# Patient Record
Sex: Male | Born: 1990 | Race: White | Hispanic: No | Marital: Single | Smoking: Never smoker
Health system: Southern US, Community
[De-identification: ages and names within clinical notes are randomized; demographics above are authoritative.]

## PROBLEM LIST (undated history)

## (undated) HISTORY — PX: OTHER SURGICAL HISTORY: SHX169

---
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
## 2016-04-13 ENCOUNTER — Encounter (HOSPITAL_COMMUNITY): Payer: Self-pay | Admitting: Emergency Medicine

## 2016-04-13 ENCOUNTER — Emergency Department (HOSPITAL_COMMUNITY)
Admission: EM | Admit: 2016-04-13 | Discharge: 2016-04-14 | Disposition: A | Discharge status: Home / Self Care | Payer: Self-pay | Attending: Emergency Medicine | Admitting: Emergency Medicine

## 2016-04-13 ENCOUNTER — Emergency Department (HOSPITAL_COMMUNITY): Payer: Self-pay

## 2016-04-13 DIAGNOSIS — W25XXXA Contact with sharp glass, initial encounter: Secondary | ICD-10-CM | POA: Insufficient documentation

## 2016-04-13 DIAGNOSIS — Y9289 Other specified places as the place of occurrence of the external cause: Secondary | ICD-10-CM | POA: Insufficient documentation

## 2016-04-13 DIAGNOSIS — IMO0002 Reserved for concepts with insufficient information to code with codable children: Secondary | ICD-10-CM

## 2016-04-13 DIAGNOSIS — S51811A Laceration without foreign body of right forearm, initial encounter: Secondary | ICD-10-CM | POA: Insufficient documentation

## 2016-04-13 DIAGNOSIS — Y999 Unspecified external cause status: Secondary | ICD-10-CM | POA: Insufficient documentation

## 2016-04-13 DIAGNOSIS — S4991XA Unspecified injury of right shoulder and upper arm, initial encounter: Secondary | ICD-10-CM

## 2016-04-13 DIAGNOSIS — Y939 Activity, unspecified: Secondary | ICD-10-CM | POA: Insufficient documentation

## 2016-04-13 LAB — ETHANOL: Alcohol, Ethyl (B): 90 mg/dL — ABNORMAL HIGH (ref <5)

## 2016-04-13 MED ORDER — CEFAZOLIN IN D5W 1 GM/50ML IV SOLN
1.0000 g | Freq: Once | INTRAVENOUS | Status: AC
  Administered 2016-04-13: 1 g via INTRAVENOUS
  Filled 2016-04-13: qty 50

## 2016-04-13 MED ORDER — LIDOCAINE-EPINEPHRINE (PF) 2 %-1:200000 IJ SOLN
20.0000 mL | Freq: Once | INTRAMUSCULAR | Status: AC
  Administered 2016-04-13: 20 mL via INTRADERMAL
  Filled 2016-04-13: qty 20

## 2016-04-13 MED ORDER — HYDROMORPHONE HCL 1 MG/ML IJ SOLN
1.0000 mg | Freq: Once | INTRAMUSCULAR | Status: AC
  Administered 2016-04-13: 1 mg via INTRAVENOUS
  Filled 2016-04-13: qty 1

## 2016-04-13 MED ORDER — LORAZEPAM 2 MG/ML IJ SOLN
1.0000 mg | Freq: Once | INTRAMUSCULAR | Status: AC
  Administered 2016-04-13: 1 mg via INTRAVENOUS
  Filled 2016-04-13: qty 1

## 2016-04-13 NOTE — ED Triage Notes (Signed)
Pt brought to ED by EMS from home after falling and punch his arm trough a glass door.  Pt has a 3 - 4 inch laceration on his right forearm, bleed control and good pulses present. 10/10 throbbing pain at this time. VS, 134/90, HR 80, R 16, SPO2 98% on RA.

## 2016-04-13 NOTE — ED Provider Notes (Signed)
[facility] Provider Note   CSN: [id number] Arrival date & time: 04/13/16  2024     History   Chief Complaint Chief Complaint  Patient presents with  . Fall    etoh  . Arm Injury    HPI Ernst SpellGilbert Barefield is a 25 y.o. male.  HPI Patient is a 25 year old male no pertinent past medical history who comes in today after having "tripped over my nephew's toy and reached out, and my arm went through the window."  Patient experienced immediate pain with moderate blood loss per mother.  Patient denies numbness weakness or limitation of range of motion.  Patient states pain 10 out of 10.   History reviewed. No pertinent past medical history.  There are no active problems to display for this patient.   Past Surgical History:  Procedure Laterality Date  . ortho surgery left leg         Home Medications    Prior to Admission medications   Medication Sig Start Date End Date Taking? Authorizing Provider  ibuprofen (ADVIL,MOTRIN) 800 MG tablet Take 800 mg by mouth every 8 (eight) hours as needed for moderate pain.   Yes Historical Provider, MD    Family History History reviewed. No pertinent family history.  Social History Social History  Substance Use Topics  . Smoking status: Never Smoker  . Smokeless tobacco: Never Used  . Alcohol use Yes     Comment: occassionally     Allergies   Review of patient's allergies indicates no known allergies.   Review of Systems Review of Systems  Respiratory: Negative for chest tightness and shortness of breath.   Skin: Positive for wound.  All other systems reviewed and are negative.    Physical Exam Updated Vital Signs BP 139/80 (BP Location: Left Arm)   Pulse 73   Temp 98.7 F (37.1 C) (Oral)   Resp 20   Ht 5\' 4"  (1.626 m)   Wt 65.8 kg   SpO2 100%   BMI 24.89 kg/m   Physical Exam  Constitutional: He appears well-developed and well-nourished.  HENT:  Head: Normocephalic and atraumatic.  Eyes: Conjunctivae are  normal.  Neck: Neck supple.  Cardiovascular: Normal rate and regular rhythm.   No murmur heard. Pulmonary/Chest: Effort normal and breath sounds normal. No respiratory distress.  Abdominal: Soft. There is no tenderness.  Musculoskeletal: Normal range of motion. He exhibits no edema.  Neurological: He is alert.  Skin: Skin is warm and dry.  As in MDM  Psychiatric: He has a normal mood and affect.  Nursing note and vitals reviewed.    ED Treatments / Results  Labs (all labs ordered are listed, but only abnormal results are displayed) Labs Reviewed  ETHANOL - Abnormal; Notable for the following:       Result Value   Alcohol, Ethyl (B) 90 (*)    All other components within normal limits    EKG  EKG Interpretation None       Radiology CLINICAL DATA:  Laceration on the right forearm by a glass door. Initial encounter.  EXAM: RIGHT FOREARM - 2 VIEW  COMPARISON:  None.  FINDINGS: Bandaging is seen along the volar aspect of the distal forearm. No radiopaque foreign body is identified. No fracture or dislocation.  IMPRESSION: Laceration without fracture or foreign body.  Procedures .Marland Kitchen.Laceration Repair Date/Time: 04/14/2016 12:46 AM Performed by: Ruthell RummageLUCKEY, Maridel Pixler Authorized by: Ruthell RummageLUCKEY, Nicholai Willette   Consent:    Consent obtained:  Verbal   Consent given by:  Patient, parent and spouse   Risks discussed:  Infection, pain, poor cosmetic result, need for additional repair, nerve damage, poor wound healing, vascular damage, tendon damage and retained foreign body   Alternatives discussed:  No treatment Anesthesia (see MAR for exact dosages):    Anesthesia method:  Local infiltration   Local anesthetic:  Lidocaine 2% WITH epi Laceration details:    Location:  Shoulder/arm   Shoulder/arm location:  R lower arm   Length (cm):  20 Repair type:    Repair type:  Complex Pre-procedure details:    Preparation:  Patient was prepped and draped in usual sterile fashion and imaging  obtained to evaluate for foreign bodies Exploration:    Limited defect created (wound extended): no     Hemostasis achieved with:  Tied off vessels   Wound exploration: wound explored through full range of motion and entire depth of wound probed and visualized     Wound extent: fascia violated     Wound extent: no areolar tissue violation noted, no foreign bodies/material noted, no muscle damage noted, no nerve damage noted, no tendon damage noted, no underlying fracture noted and no vascular damage noted     Contaminated: no   Treatment:    Area cleansed with:  Saline   Amount of cleaning:  Extensive   Irrigation solution:  Sterile water   Irrigation volume:  3l   Irrigation method:  Syringe and tap   Visualized foreign bodies/material removed: no     Debridement:  Moderate   Undermining:  Extensive   Scar revision: no   Fascia repair:    Suture size:  4-0   Suture material:  Vicryl   Suture technique:  Simple interrupted   Number of sutures:  3 Subcutaneous repair:    Suture size:  4-0   Suture material:  Vicryl   Suture technique:  Simple interrupted   Number of sutures:  12 Skin repair:    Repair method:  Sutures   Suture size:  4-0   Suture material:  Prolene   Number of sutures:  60 Approximation:    Approximation:  Close Post-procedure details:    Dressing:  Antibiotic ointment, non-adherent dressing and sterile dressing   Patient tolerance of procedure:  Tolerated with difficulty    (including critical care time)  Medications Ordered in ED Medications  lidocaine-EPINEPHrine (XYLOCAINE W/EPI) 2 %-1:200000 (PF) injection 20 mL (20 mLs Intradermal Given 04/13/16 2109)  HYDROmorphone (DILAUDID) injection 1 mg (1 mg Intravenous Given 04/13/16 2116)  ceFAZolin (ANCEF) IVPB 1 g/50 mL premix (0 g Intravenous Stopped 04/13/16 2301)  LORazepam (ATIVAN) injection 1 mg (1 mg Intravenous Given 04/13/16 2231)  HYDROmorphone (DILAUDID) injection 1 mg (1 mg Intravenous Given 04/13/16  2231)  lidocaine-EPINEPHrine (XYLOCAINE W/EPI) 2 %-1:200000 (PF) injection 20 mL (20 mLs Intradermal Given 04/13/16 2239)  lidocaine-EPINEPHrine (XYLOCAINE W/EPI) 2 %-1:200000 (PF) injection 20 mL (20 mLs Intradermal Given 04/13/16 2239)     Initial Impression / Assessment and Plan / ED Course  I have reviewed the triage vital signs and the nursing notes.  Pertinent labs & imaging results that were available during my care of the patient were reviewed by me and considered in my medical decision making (see chart for details).   Clinical Course  Patient is a 25 year old male no pertinent past medical history who comes in today after having "tripped over my nephew's toy and reached out, and my arm went through the window."  Patient experienced immediate pain with moderate blood  loss per mother.  Patient denies numbness weakness or limitation of range of motion.  Patient states pain 10 out of 10.  Physical exam: There are 3 large lacerations extending from just proximal to the wrist to just distal to the elbow on the right forearm.  2 of 3 lacerations hemostatic third laceration has active venous bleed.  Patient with good distal pulses.  Patient has full range of motion of both wrist flexors and extensors, as well as flexion and extension of all digits.  Patient neurovascularly intact.  Remainder physical exam within normal limits.  We will give patient Dilaudid for pain, and will perform laceration repair with suture.  Please see above documentation of procedure.  Laceration repair is complete.  1 g Ancef given.  Patient given appropriate wound care instructions, return precautions, and follow-up instructions.  Patient agreeable to discharge at this time.   Final Clinical Impressions(s) / ED Diagnoses   Final diagnoses:  Laceration  Arm injury, right, initial encounter    New Prescriptions Discharge Medication List as of 04/14/2016 12:50 AM       Caren Griffins, MD 04/15/16 1610    Caren Griffins, MD 04/15/16 2224    Pricilla Loveless, MD 04/22/16 [phone]

## 2016-04-13 NOTE — ED Notes (Signed)
Patient transported to X-ray 

## 2016-04-26 ENCOUNTER — Emergency Department (HOSPITAL_COMMUNITY)
Admission: EM | Admit: 2016-04-26 | Discharge: 2016-04-26 | Disposition: A | Discharge status: Home / Self Care | Payer: No Typology Code available for payment source | Attending: Emergency Medicine | Admitting: Emergency Medicine

## 2016-04-26 ENCOUNTER — Encounter (HOSPITAL_COMMUNITY): Payer: Self-pay

## 2016-04-26 DIAGNOSIS — Y829 Unspecified medical devices associated with adverse incidents: Secondary | ICD-10-CM | POA: Insufficient documentation

## 2016-04-26 DIAGNOSIS — Z4802 Encounter for removal of sutures: Secondary | ICD-10-CM

## 2016-04-26 DIAGNOSIS — T8131XA Disruption of external operation (surgical) wound, not elsewhere classified, initial encounter: Secondary | ICD-10-CM | POA: Insufficient documentation

## 2016-04-26 NOTE — ED Provider Notes (Signed)
[facility] Provider Note   CSN: [id number] Arrival date & time: 04/26/16  [id number]   By signing my name below, I, Christel MormonMatthew Jamison, attest that this documentation has been prepared under the direction and in the presence of Roxy Horsemanobert Hadlei Stitt, PA-C. Electronically Signed: Christel MormonMatthew Jamison, Scribe. 04/26/2016. 5:15 PM.   History   Chief Complaint Chief Complaint  Patient presents with  . Suture / Staple Removal    The history is provided by the patient. No language interpreter was used.   HPI Comments:  Ernst SpellGilbert Covelli is a 25 y.o. male who presents to the Emergency Department for a suture removal for sutures that he received on 04/13/16. Pt states that he tripped over a toy outside and fell through a window sustain several lacerations on his R forearm. Pt notes some purulent drainage from the wound. Pt has been using neosporin on the wound with some relief.    History reviewed. No pertinent past medical history.  There are no active problems to display for this patient.   Past Surgical History:  Procedure Laterality Date  . ortho surgery left leg         Home Medications    Prior to Admission medications   Medication Sig Start Date End Date Taking? Authorizing Provider  ibuprofen (ADVIL,MOTRIN) 800 MG tablet Take 800 mg by mouth every 8 (eight) hours as needed for moderate pain.    Historical Provider, MD    Family History No family history on file.  Social History Social History  Substance Use Topics  . Smoking status: Never Smoker  . Smokeless tobacco: Never Used  . Alcohol use Yes     Comment: occassionally     Allergies   Review of patient's allergies indicates no known allergies.   Review of Systems Review of Systems  Constitutional: Negative for fever.  Skin: Positive for wound.     Physical Exam Updated Vital Signs BP 131/82 (BP Location: Left Arm)   Pulse 73   Temp 98.1 F (36.7 C) (Oral)   Resp 14   SpO2 100%   Physical Exam    Constitutional: He is oriented to person, place, and time. He appears well-developed and well-nourished.  HENT:  Head: Normocephalic and atraumatic.  Eyes: Conjunctivae and EOM are normal.  Neck: Normal range of motion.  Cardiovascular: Normal rate.   Pulmonary/Chest: Effort normal.  Abdominal: He exhibits no distension.  Musculoskeletal: Normal range of motion.  Normal ROM and strength of right hand  Neurological: He is alert and oriented to person, place, and time.  Skin: Skin is dry.  Well healing lacerations to right forearm, one small area of dehiscence on the most radial laceration, otherwise well healing, no sign of infection  Psychiatric: He has a normal mood and affect. His behavior is normal. Judgment and thought content normal.  Nursing note and vitals reviewed.    ED Treatments / Results  DIAGNOSTIC STUDIES:  Oxygen Saturation is 100% on RA, normal by my interpretation.    COORDINATION OF CARE:  5:17 PM Discussed treatment plan with pt at bedside and pt agreed to plan.   Labs (all labs ordered are listed, but only abnormal results are displayed) Labs Reviewed - No data to display  EKG  EKG Interpretation None       Radiology No results found.  Procedures Procedures (including critical care time)  Medications Ordered in ED Medications - No data to display   Initial Impression / Assessment and Plan / ED Course  I have  reviewed the triage vital signs and the nursing notes.  Pertinent labs & imaging results that were available during my care of the patient were reviewed by me and considered in my medical decision making (see chart for details).  Clinical Course    SUTURE REMOVAL Performed by: Roxy Horseman and Lysle Dingwall, PA-S  Consent: Verbal consent obtained. Patient identity confirmed: provided demographic data Time out: Immediately prior to procedure a "time out" was called to verify the correct patient, procedure, equipment, support  staff and site/side marked as required.  Location details: Right forearm  Wound Appearance: clean  Sutures/Staples Removed: 62  Facility: sutures placed in this facility Patient tolerance: Patient tolerated the procedure well with no immediate complications.     Final Clinical Impressions(s) / ED Diagnoses   Final diagnoses:  Visit for suture removal    New Prescriptions New Prescriptions   No medications on file   I personally performed the services described in this documentation, which was scribed in my presence. The recorded information has been reviewed and is accurate.       Roxy Horseman, PA-C 04/26/16 1919    Doug Sou, MD 04/27/16 [phone]

## 2016-04-26 NOTE — ED Triage Notes (Signed)
Patient here for suture removal from right forearm, in place x 12 days, no complaints, minimal redness noted

## 2016-04-26 NOTE — ED Notes (Signed)
Declined W/C at D/C and was escorted to lobby by RN. 

## 2020-07-02 ENCOUNTER — Emergency Department (HOSPITAL_COMMUNITY)
Admission: EM | Admit: 2020-07-02 | Discharge: 2020-07-03 | Disposition: A | Discharge status: Home / Self Care | Payer: Self-pay | Attending: Emergency Medicine | Admitting: Emergency Medicine

## 2020-07-02 ENCOUNTER — Other Ambulatory Visit: Payer: Self-pay

## 2020-07-02 ENCOUNTER — Encounter (HOSPITAL_COMMUNITY): Payer: Self-pay | Admitting: Emergency Medicine

## 2020-07-02 ENCOUNTER — Emergency Department (HOSPITAL_COMMUNITY): Payer: Self-pay

## 2020-07-02 DIAGNOSIS — Y92410 Unspecified street and highway as the place of occurrence of the external cause: Secondary | ICD-10-CM | POA: Insufficient documentation

## 2020-07-02 DIAGNOSIS — Y9389 Activity, other specified: Secondary | ICD-10-CM | POA: Insufficient documentation

## 2020-07-02 DIAGNOSIS — S0081XA Abrasion of other part of head, initial encounter: Secondary | ICD-10-CM | POA: Insufficient documentation

## 2020-07-02 DIAGNOSIS — M21922 Unspecified acquired deformity of left upper arm: Secondary | ICD-10-CM

## 2020-07-02 DIAGNOSIS — S42402A Unspecified fracture of lower end of left humerus, initial encounter for closed fracture: Secondary | ICD-10-CM | POA: Insufficient documentation

## 2020-07-02 DIAGNOSIS — T148XXA Other injury of unspecified body region, initial encounter: Secondary | ICD-10-CM

## 2020-07-02 LAB — CBC WITH DIFFERENTIAL/PLATELET
Abs Immature Granulocytes: 0.02 10*3/uL (ref 0.00–0.07)
Basophils Absolute: 0.1 10*3/uL (ref 0.0–0.1)
Basophils Relative: 1 %
Eosinophils Absolute: 0.4 10*3/uL (ref 0.0–0.5)
Eosinophils Relative: 5 %
HCT: 41.7 % (ref 39.0–52.0)
Hemoglobin: 13.6 g/dL (ref 13.0–17.0)
Immature Granulocytes: 0 %
Lymphocytes Relative: 42 %
Lymphs Abs: 3.2 10*3/uL (ref 0.7–4.0)
MCH: 27.9 pg (ref 26.0–34.0)
MCHC: 32.6 g/dL (ref 30.0–36.0)
MCV: 85.6 fL (ref 80.0–100.0)
Monocytes Absolute: 0.6 10*3/uL (ref 0.1–1.0)
Monocytes Relative: 8 %
Neutro Abs: 3.4 10*3/uL (ref 1.7–7.7)
Neutrophils Relative %: 44 %
Platelets: 265 10*3/uL (ref 150–400)
RBC: 4.87 MIL/uL (ref 4.22–5.81)
RDW: 13.1 % (ref 11.5–15.5)
WBC: 7.8 10*3/uL (ref 4.0–10.5)
nRBC: 0 % (ref 0.0–0.2)

## 2020-07-02 LAB — BASIC METABOLIC PANEL
Anion gap: 10 (ref 5–15)
BUN: 18 mg/dL (ref 6–20)
CO2: 22 mmol/L (ref 22–32)
Calcium: 9.1 mg/dL (ref 8.9–10.3)
Chloride: 104 mmol/L (ref 98–111)
Creatinine, Ser: 1.07 mg/dL (ref 0.61–1.24)
GFR, Estimated: >60 mL/min (ref >60)
Glucose, Bld: 157 mg/dL — ABNORMAL HIGH (ref 70–99)
Potassium: 3.7 mmol/L (ref 3.5–5.1)
Sodium: 136 mmol/L (ref 135–145)

## 2020-07-02 MED ORDER — HYDROMORPHONE HCL 1 MG/ML IJ SOLN
1.0000 mg | Freq: Once | INTRAMUSCULAR | Status: AC
  Administered 2020-07-02: 1 mg via INTRAVENOUS
  Filled 2020-07-02: qty 1

## 2020-07-02 MED ORDER — SODIUM CHLORIDE 0.9 % IV BOLUS
1000.0000 mL | Freq: Once | INTRAVENOUS | Status: AC
  Administered 2020-07-02: 1000 mL via INTRAVENOUS
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
## 2020-07-03 ENCOUNTER — Emergency Department (HOSPITAL_COMMUNITY): Payer: Self-pay

## 2020-07-03 MED ORDER — HYDROMORPHONE HCL 1 MG/ML IJ SOLN
1.0000 mg | Freq: Once | INTRAMUSCULAR | Status: AC
  Administered 2020-07-03: 1 mg via INTRAVENOUS
  Filled 2020-07-03: qty 1

## 2020-07-03 MED ORDER — HYDROCODONE-ACETAMINOPHEN 5-325 MG PO TABS
0.5000 | ORAL_TABLET | Freq: Three times a day (TID) | ORAL | 0 refills | Status: AC | PRN
Start: 2020-07-03 — End: 2020-07-08

## 2020-07-03 MED ORDER — KETAMINE HCL 50 MG/5ML IJ SOSY
40.0000 mg | PREFILLED_SYRINGE | Freq: Once | INTRAMUSCULAR | Status: AC
  Administered 2020-07-03: 40 mg via INTRAVENOUS
  Filled 2020-07-03: qty 5

## 2020-07-03 MED ORDER — PROPOFOL 10 MG/ML IV BOLUS
40.0000 mg | Freq: Once | INTRAVENOUS | Status: AC
  Administered 2020-07-03: 70 mg via INTRAVENOUS
  Filled 2020-07-03: qty 20

## 2020-07-03 NOTE — Sedation Documentation (Addendum)
10mg   prop, 10mg  ketamine
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
## 2020-07-03 NOTE — Progress Notes (Signed)
RT present for conscious sedation with BMV at bedside.  Pt tolerated procedure well, able to speak after. and all VS remaining withiin normal range throughout.  RT will continue to monitor as needed.

## 2020-07-03 NOTE — Discharge Instructions (Addendum)
For pain control you may take at 1000 mg of Tylenol every 8 hours scheduled.  In addition you can take 0.5 to 1 tablet of Norco/Vicodin every 8 hours for pain not controlled with the scheduled Tylenol.  

## 2020-07-03 NOTE — ED Provider Notes (Signed)
  [facility] EMERGENCY DEPARTMENT Provider Note      Procedures Reduction of dislocation  Date/Time: 07/03/2020 1:54 AM Performed by: Roxy Horseman, PA-C Authorized by: Nira Conn, MD  Preparation: Patient was prepped and draped in the usual sterile fashion.  Sedation: Patient sedated: yes (See Dr. Harland Dingwall note)  Patient tolerance: patient tolerated the procedure well with no immediate complications Comments: Left elbow dislocation reduced by me under direct supervision of Dr. Eudelia Bunch.        Roxy Horseman, PA-C 07/03/20 0155    Nira Conn, MD 07/03/20 [phone]

## 2020-07-03 NOTE — Progress Notes (Signed)
Orthopedic Tech Progress Note Patient Details:  Eric Snow 1990/12/21 [id number]  Ortho Devices Type of Ortho Device: Sling immobilizer, Sugartong splint Ortho Device/Splint Location: Left Upper Extremity Ortho Device/Splint Interventions: Ordered, Application   Post Interventions Patient Tolerated: Well Instructions Provided: Adjustment of device, Care of device, Poper ambulation with device   [name] 07/03/2020, 2:05 AM

## 2020-07-03 NOTE — Sedation Documentation (Addendum)
30mg  of prop

## 2020-08-13 ENCOUNTER — Other Ambulatory Visit: Payer: Self-pay

## 2020-08-13 ENCOUNTER — Ambulatory Visit: Payer: Self-pay | Attending: Physician Assistant | Admitting: Physician Assistant

## 2020-08-13 DIAGNOSIS — Z09 Encounter for follow-up examination after completed treatment for conditions other than malignant neoplasm: Secondary | ICD-10-CM

## 2020-08-13 DIAGNOSIS — S42402G Unspecified fracture of lower end of left humerus, subsequent encounter for fracture with delayed healing: Secondary | ICD-10-CM

## 2020-08-13 DIAGNOSIS — R739 Hyperglycemia, unspecified: Secondary | ICD-10-CM
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
## 2020-08-29 ENCOUNTER — Ambulatory Visit: Payer: Self-pay | Admitting: Physician Assistant
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
